# Patient Record
Sex: Male | Born: 1955 | Race: White | Hispanic: No | Marital: Single | Smoking: Current every day smoker
Health system: Southern US, Community
[De-identification: ages and names within clinical notes are randomized; demographics above are authoritative.]

---

## 1999-11-24 ENCOUNTER — Ambulatory Visit (HOSPITAL_COMMUNITY): Admission: RE | Admit: 1999-11-24 | Discharge: 1999-11-24 | Payer: Self-pay | Admitting: Urology

## 2000-12-16 ENCOUNTER — Encounter: Payer: Self-pay | Admitting: Emergency Medicine

## 2000-12-16 ENCOUNTER — Emergency Department (HOSPITAL_COMMUNITY): Admission: EM | Admit: 2000-12-16 | Discharge: 2000-12-16 | Payer: Self-pay | Admitting: *Deleted

## 2001-11-17 ENCOUNTER — Observation Stay (HOSPITAL_COMMUNITY): Admission: AC | Admit: 2001-11-17 | Discharge: 2001-11-17 | Payer: Self-pay

## 2001-11-17 ENCOUNTER — Encounter: Payer: Self-pay | Admitting: Surgery

## 2001-11-17 ENCOUNTER — Encounter: Payer: Self-pay | Admitting: Emergency Medicine

## 2002-07-01 ENCOUNTER — Encounter: Payer: Self-pay | Admitting: Emergency Medicine

## 2002-07-01 ENCOUNTER — Emergency Department (HOSPITAL_COMMUNITY): Admission: EM | Admit: 2002-07-01 | Discharge: 2002-07-01 | Payer: Self-pay | Admitting: Emergency Medicine

## 2002-07-02 ENCOUNTER — Emergency Department (HOSPITAL_COMMUNITY): Admission: EM | Admit: 2002-07-02 | Discharge: 2002-07-02 | Payer: Self-pay | Admitting: Emergency Medicine

## 2002-07-08 ENCOUNTER — Emergency Department (HOSPITAL_COMMUNITY): Admission: EM | Admit: 2002-07-08 | Discharge: 2002-07-08 | Payer: Self-pay | Admitting: Emergency Medicine

## 2002-07-11 ENCOUNTER — Encounter (HOSPITAL_COMMUNITY): Admission: RE | Admit: 2002-07-11 | Discharge: 2002-10-09 | Payer: Self-pay | Admitting: *Deleted

## 2009-01-20 ENCOUNTER — Emergency Department (HOSPITAL_COMMUNITY): Admission: EM | Admit: 2009-01-20 | Discharge: 2009-01-20 | Payer: Self-pay | Admitting: Emergency Medicine

## 2009-11-05 ENCOUNTER — Emergency Department (HOSPITAL_COMMUNITY): Admission: EM | Admit: 2009-11-05 | Discharge: 2009-11-05 | Payer: Self-pay | Admitting: Family Medicine

## 2010-08-06 LAB — COMPREHENSIVE METABOLIC PANEL
ALT: 23 U/L (ref 0–53)
AST: 26 U/L (ref 0–37)
Albumin: 4 g/dL (ref 3.5–5.2)
Alkaline Phosphatase: 55 U/L (ref 39–117)
BUN: 19 mg/dL (ref 6–23)
CO2: 25 mEq/L (ref 19–32)
Calcium: 9.5 mg/dL (ref 8.4–10.5)
Chloride: 107 mEq/L (ref 96–112)
Creatinine, Ser: 1.15 mg/dL (ref 0.4–1.5)
GFR calc Af Amer: >60 mL/min (ref >60)
GFR calc non Af Amer: >60 mL/min (ref >60)
Glucose, Bld: 105 mg/dL — ABNORMAL HIGH (ref 70–99)
Potassium: 4.1 mEq/L (ref 3.5–5.1)
Sodium: 139 mEq/L (ref 135–145)
Total Bilirubin: 0.7 mg/dL (ref 0.3–1.2)
Total Protein: 7.1 g/dL (ref 6.0–8.3)

## 2010-08-06 LAB — DIFFERENTIAL
Eosinophils Absolute: 0.1 10*3/uL (ref 0.0–0.7)
Eosinophils Relative: 2 % (ref 0–5)
Lymphs Abs: 2.1 10*3/uL (ref 0.7–4.0)
Monocytes Absolute: 0.8 10*3/uL (ref 0.1–1.0)
Monocytes Relative: 9 % (ref 3–12)

## 2010-08-06 LAB — CBC
HCT: 41.4 % (ref 39.0–52.0)
Hemoglobin: 14.4 g/dL (ref 13.0–17.0)
MCHC: 34.7 g/dL (ref 30.0–36.0)
MCV: 95.7 fL (ref 78.0–100.0)
Platelets: 210 10*3/uL (ref 150–400)
RBC: 4.33 MIL/uL (ref 4.22–5.81)
RDW: 13.5 % (ref 11.5–15.5)
WBC: 9.2 10*3/uL (ref 4.0–10.5)

## 2010-08-06 LAB — CK TOTAL AND CKMB (NOT AT ARMC)
CK, MB: 2.8 ng/mL (ref 0.3–4.0)
Relative Index: 2.1 (ref 0.0–2.5)
Total CK: 133 U/L (ref 7–232)

## 2013-08-05 ENCOUNTER — Other Ambulatory Visit (HOSPITAL_COMMUNITY): Payer: Self-pay | Admitting: Specialist

## 2013-08-05 ENCOUNTER — Ambulatory Visit (HOSPITAL_COMMUNITY)
Admission: RE | Admit: 2013-08-05 | Discharge: 2013-08-05 | Disposition: A | Discharge status: Home / Self Care | Payer: BC Managed Care – PPO | Source: Ambulatory Visit | Attending: Specialist | Admitting: Specialist

## 2013-08-05 DIAGNOSIS — Z0189 Encounter for other specified special examinations: Secondary | ICD-10-CM | POA: Insufficient documentation

## 2013-08-05 DIAGNOSIS — Z01818 Encounter for other preprocedural examination: Secondary | ICD-10-CM

## 2013-08-09 ENCOUNTER — Other Ambulatory Visit: Payer: Self-pay | Admitting: Specialist

## 2013-08-09 DIAGNOSIS — T1590XA Foreign body on external eye, part unspecified, unspecified eye, initial encounter: Secondary | ICD-10-CM

## 2013-08-14 ENCOUNTER — Ambulatory Visit
Admission: RE | Admit: 2013-08-14 | Discharge: 2013-08-14 | Disposition: A | Discharge status: Home / Self Care | Payer: BC Managed Care – PPO | Source: Ambulatory Visit | Attending: Specialist | Admitting: Specialist

## 2013-08-14 DIAGNOSIS — T1590XA Foreign body on external eye, part unspecified, unspecified eye, initial encounter: Secondary | ICD-10-CM

## 2014-08-21 ENCOUNTER — Emergency Department (HOSPITAL_COMMUNITY)
Admission: EM | Admit: 2014-08-21 | Discharge: 2014-08-21 | Disposition: A | Discharge status: Home / Self Care | Payer: Self-pay | Attending: Emergency Medicine | Admitting: Emergency Medicine

## 2014-08-21 ENCOUNTER — Encounter (HOSPITAL_COMMUNITY): Payer: Self-pay | Admitting: Physical Medicine and Rehabilitation

## 2014-08-21 ENCOUNTER — Emergency Department (HOSPITAL_COMMUNITY): Payer: Self-pay

## 2014-08-21 DIAGNOSIS — Z720 Tobacco use: Secondary | ICD-10-CM | POA: Insufficient documentation

## 2014-08-21 DIAGNOSIS — Y9289 Other specified places as the place of occurrence of the external cause: Secondary | ICD-10-CM | POA: Insufficient documentation

## 2014-08-21 DIAGNOSIS — Z23 Encounter for immunization: Secondary | ICD-10-CM | POA: Insufficient documentation

## 2014-08-21 DIAGNOSIS — Y998 Other external cause status: Secondary | ICD-10-CM | POA: Insufficient documentation

## 2014-08-21 DIAGNOSIS — S6991XA Unspecified injury of right wrist, hand and finger(s), initial encounter: Secondary | ICD-10-CM | POA: Insufficient documentation

## 2014-08-21 DIAGNOSIS — Y9389 Activity, other specified: Secondary | ICD-10-CM | POA: Insufficient documentation

## 2014-08-21 DIAGNOSIS — W228XXA Striking against or struck by other objects, initial encounter: Secondary | ICD-10-CM | POA: Insufficient documentation

## 2014-08-21 DIAGNOSIS — M7989 Other specified soft tissue disorders: Secondary | ICD-10-CM

## 2014-08-21 MED ORDER — TETANUS-DIPHTH-ACELL PERTUSSIS 5-2.5-18.5 LF-MCG/0.5 IM SUSP
0.5000 mL | Freq: Once | INTRAMUSCULAR | Status: AC
  Administered 2014-08-21: 0.5 mL via INTRAMUSCULAR
  Filled 2014-08-21: qty 0.5

## 2014-08-21 MED ORDER — IBUPROFEN 800 MG PO TABS: 800.0000 mg | ORAL_TABLET | Freq: Three times a day (TID) | ORAL | Status: AC

## 2014-08-21 MED ORDER — CEPHALEXIN 500 MG PO CAPS: 1000.0000 mg | ORAL_CAPSULE | Freq: Two times a day (BID) | ORAL | Status: AC

## 2014-08-21 MED ORDER — SULFAMETHOXAZOLE-TRIMETHOPRIM 800-160 MG PO TABS: 1.0000 | ORAL_TABLET | Freq: Two times a day (BID) | ORAL | Status: AC

## 2014-08-21 NOTE — ED Notes (Signed)
Pt presents to department for evaluation of R hand pain and swelling. Reports he accidentally got piece of metal stuck in hand x1 month ago. 5/10 pain upon arrival. Pt is alert and oriented x4.

## 2014-08-21 NOTE — ED Provider Notes (Signed)
CSN: [id number]     Arrival date & time 08/21/14  0810 History  This chart was scribed for non-physician practitioner, Fayrene Helper, PA-C, working with Purvis Sheffield, MD by Charline Bills, ED Scribe. This patient was seen in room TR07C/TR07C and the patient's care was started at 9:13 AM.   Chief Complaint  Patient presents with  . Hand Pain   The history is provided by the patient. No language interpreter was used.   HPI Comments: Donald COSGRIFF Sr. is a 59 y.o. male who presents to the Emergency Department complaining of gradually worsening R hand pain with associated swelling for the past week. Pt got a piece of wire stuck in his hand 1 month ago. He reports thumb tenderness and swelling 1 week ago, swelling and redness to his other fingers over the past 2 days. Pain is worse in the morning but improves as the day progesses and is exacerbated with bending his fingers. He denies fever, chills, R wrist pain, h/o arthritis or gout. No medications tried PTA. Pt is R hand dominant. Last tetanus is unknown.  No PCP.  History reviewed. No pertinent past medical history. History reviewed. No pertinent past surgical history. No family history on file. History  Substance Use Topics  . Smoking status: Current Every Day Smoker    Types: Cigarettes  . Smokeless tobacco: Not on file  . Alcohol Use: Yes    Review of Systems  Constitutional: Negative for fever and chills.  Musculoskeletal: Positive for joint swelling and arthralgias.   Allergies  Review of patient's allergies indicates no known allergies.  Home Medications   Prior to Admission medications   Not on File   BP 178/97 mmHg  Pulse 91  Temp(Src) 97.8 F (36.6 C) (Oral)  Resp 17  Ht  (1.727 m)  Wt 183 lb (83.008 kg)  BMI 27.83 kg/m2  SpO2 94% Physical Exam  Constitutional: He is oriented to person, place, and time. He appears well-developed and well-nourished. No distress.  HENT:  Head: Normocephalic and atraumatic.   Eyes: Conjunctivae and EOM are normal.  Neck: Neck supple. No tracheal deviation present.  Cardiovascular: Normal rate.   Pulmonary/Chest: Effort normal. No respiratory distress.  Musculoskeletal: Normal range of motion.  R hand: Small scab noted to the distal aspect of the thenar eminence.  Palm is mildly edematous, greater than L hand with generalized tenderness throughout but no focal point tenderness. Blanchable erythema. No abscess. No foreign object noted. Brisk cap refill small finger. Unable to make a fist 2/2 pain. Wrist is normal. Radial pulse 2+.  Forearm compartment is soft.    Neurological: He is alert and oriented to person, place, and time. No sensory deficit.  Skin: Skin is warm and dry.  Psychiatric: He has a normal mood and affect. His behavior is normal.  Nursing note and vitals reviewed.  ED Course  Procedures (including critical care time) DIAGNOSTIC STUDIES: Oxygen Saturation is 94% on RA, adequate by my interpretation.      COORDINATION OF CARE: 9:17 AM-Discussed treatment plan which includes XR and consult with hand with pt at bedside and pt agreed to plan.   9:34 AM Swelling and redness to R hand, has an old wound.  Suspect soft tissue infection, doubt abscess or tenosynovitis.  No joint involvement. Will give Tdap and also provide keflex/bactrim abx.  Pt to f/u with hand specialist for further care.  Return precaution discussed.  Ibuprofen for pain. Doubt compartment syndrome.   Labs Review Labs  Reviewed - No data to display  Imaging Review Dg Hand Complete Right  08/21/2014   CLINICAL DATA:  First metacarpal pain, injury 1 month ago, puncture injury  EXAM: RIGHT HAND - COMPLETE 3+ VIEW  COMPARISON:  None.  FINDINGS: Three views of the right hand submitted. No acute fracture or subluxation. There is old fracture of fifth metacarpal. No radiopaque foreign body.  IMPRESSION: No acute fracture or subluxation.  Old fracture of fifth metacarpal.   Electronically  Signed   By: Natasha MeadLiviu  Pop M.D.   On: 08/21/2014 08:39     EKG Interpretation None      MDM   Final diagnoses:  Swelling of right hand   BP 178/97 mmHg  Pulse 91  Temp(Src) 97.8 F (36.6 C) (Oral)  Resp 17  Ht 5\' 8"  (1.727 m)  Wt 183 lb (83.008 kg)  BMI 27.83 kg/m2  SpO2 94%   I personally performed the services described in this documentation, which was scribed in my presence. The recorded information has been reviewed and is accurate.     Fayrene HelperBowie Bianney Rockwood, PA-C 08/21/14 [id number]  Purvis SheffieldForrest Harrison, MD 08/21/14 [phone]

## 2014-08-21 NOTE — Discharge Instructions (Signed)
Please take antibiotics as prescribed for the full duration.  Take ibuprofen for pain.  Follow up with hand specialist in 2-3 days if you notice no improvement.  Return if your symptoms worsen or if you have other concerns.
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
## 2015-12-23 ENCOUNTER — Other Ambulatory Visit (HOSPITAL_COMMUNITY): Payer: Self-pay | Admitting: Nurse Practitioner

## 2015-12-23 DIAGNOSIS — B182 Chronic viral hepatitis C: Secondary | ICD-10-CM

## 2016-02-02 ENCOUNTER — Ambulatory Visit (HOSPITAL_COMMUNITY)
Admission: RE | Admit: 2016-02-02 | Discharge: 2016-02-02 | Disposition: A | Discharge status: Home / Self Care | Payer: BLUE CROSS/BLUE SHIELD | Source: Ambulatory Visit | Attending: Nurse Practitioner | Admitting: Nurse Practitioner

## 2016-02-02 ENCOUNTER — Encounter (HOSPITAL_COMMUNITY): Payer: Self-pay

## 2016-02-02 DIAGNOSIS — B182 Chronic viral hepatitis C: Secondary | ICD-10-CM

## 2016-08-04 ENCOUNTER — Other Ambulatory Visit: Payer: Self-pay | Admitting: *Deleted

## 2016-08-04 DIAGNOSIS — I70219 Atherosclerosis of native arteries of extremities with intermittent claudication, unspecified extremity: Secondary | ICD-10-CM

## 2016-09-16 ENCOUNTER — Encounter: Payer: Self-pay | Admitting: Vascular Surgery

## 2016-09-27 ENCOUNTER — Inpatient Hospital Stay (HOSPITAL_COMMUNITY): Admission: RE | Admit: 2016-09-27 | Payer: BLUE CROSS/BLUE SHIELD | Source: Ambulatory Visit

## 2016-09-27 ENCOUNTER — Encounter: Payer: BLUE CROSS/BLUE SHIELD | Admitting: Vascular Surgery

## 2018-09-05 IMAGING — US US ABDOMEN COMPLETE W/ ELASTOGRAPHY
1 series · 13 of 14 positions shown · non-contrast
Comparison: None

CLINICAL DATA: new diagnosis of hepatitis-C.



[Series 1: us abdomen complete w/ elastography · 0.14mm/px · 13 of 14 slices shown]
[im 1/14]
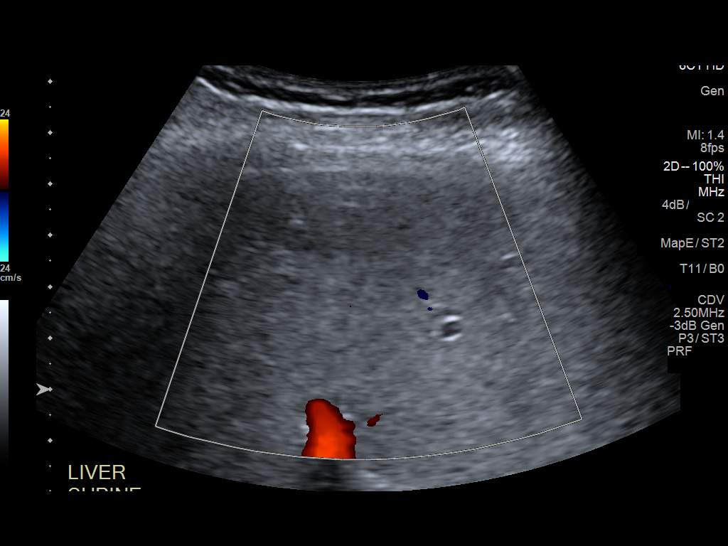
[im 2/14]
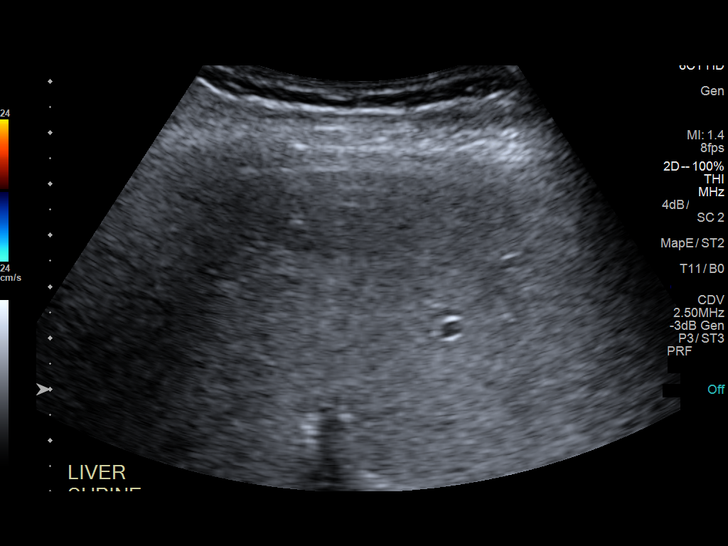
[im 3/14]
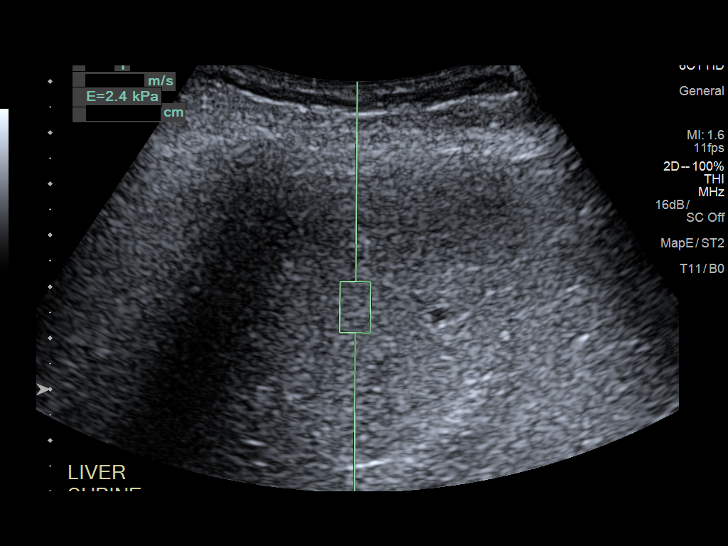
[im 4/14]
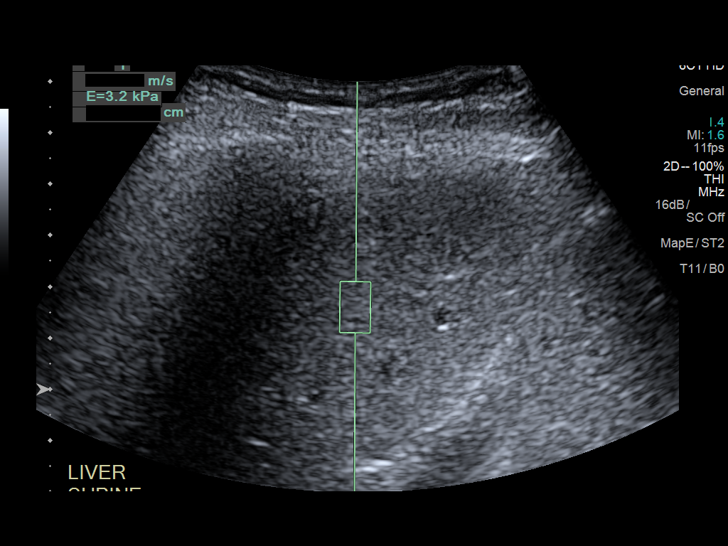
[im 5/14]
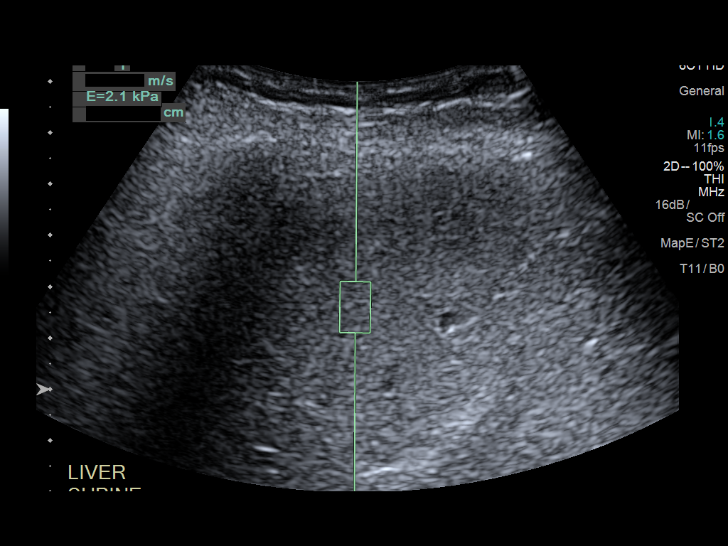
[im 6/14]
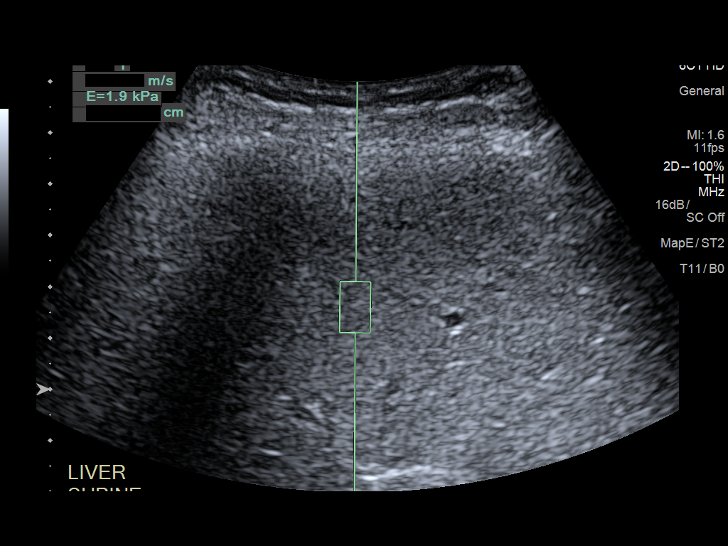
[im 8/14]
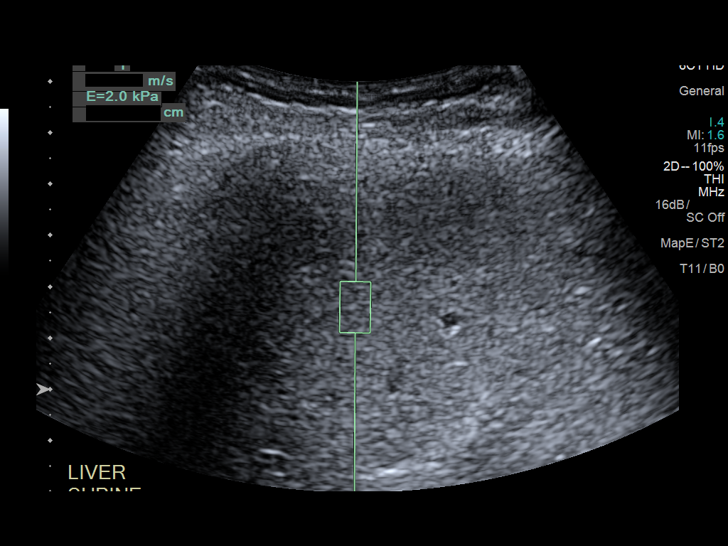
[im 9/14]
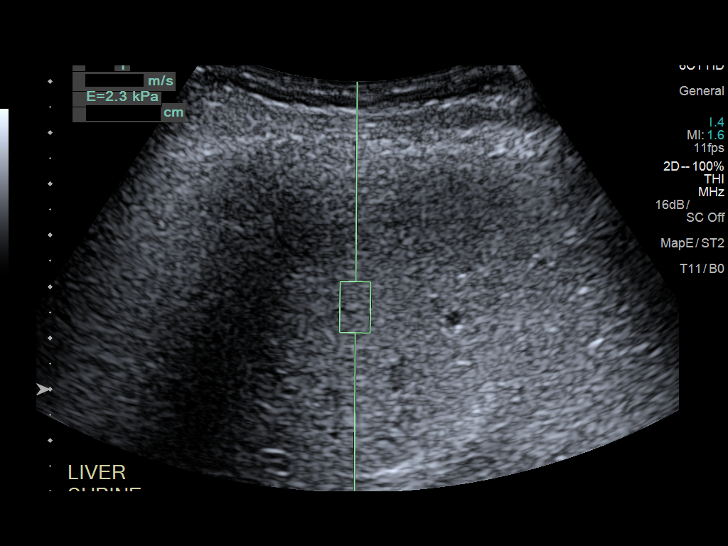
[im 10/14]
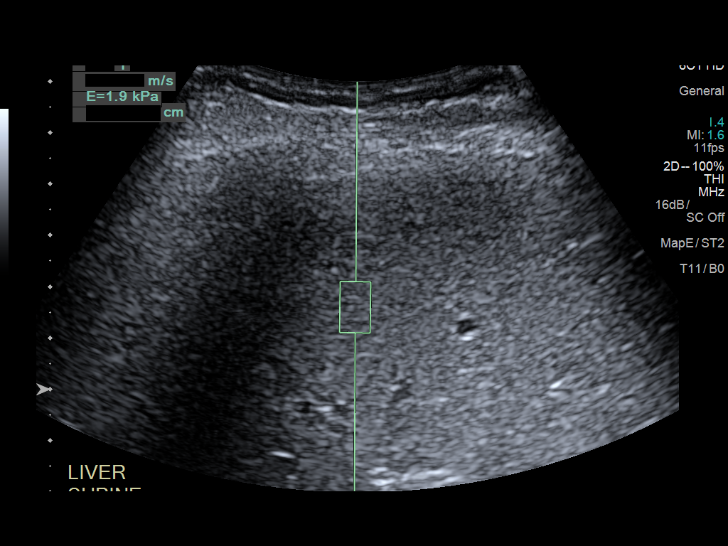
[im 11/14]
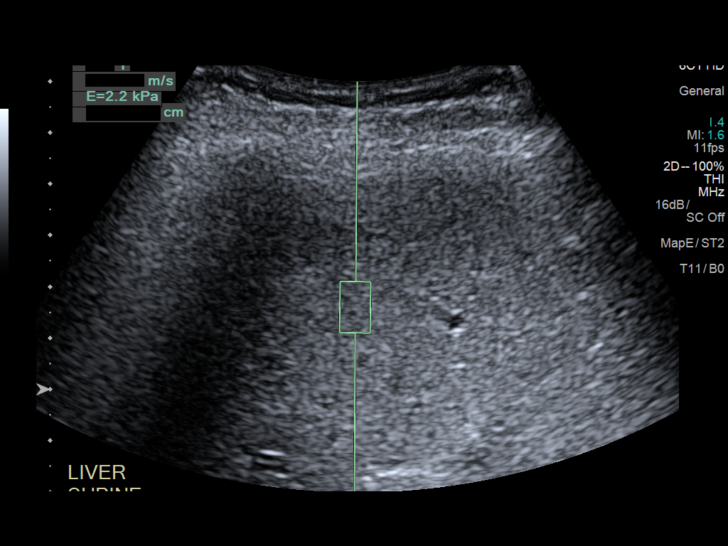
[im 12/14]
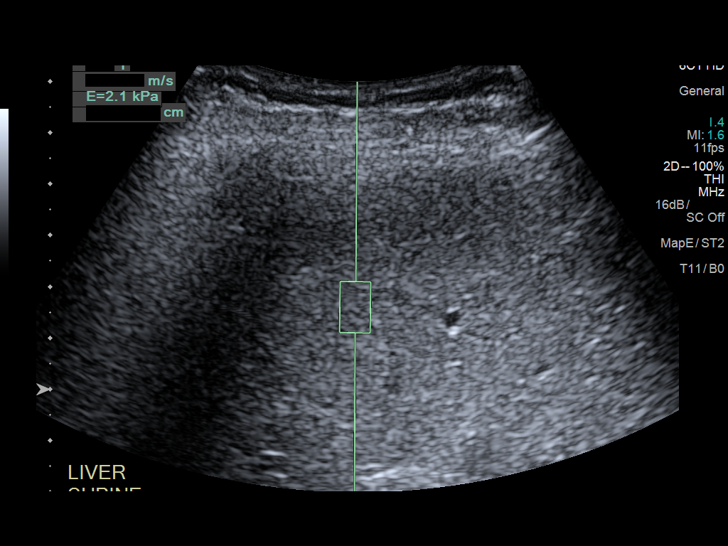
[im 13/14]
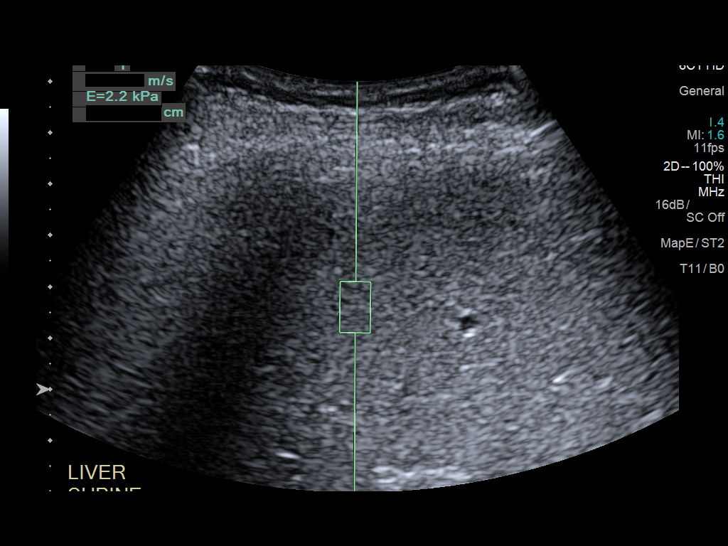
[im 14/14]
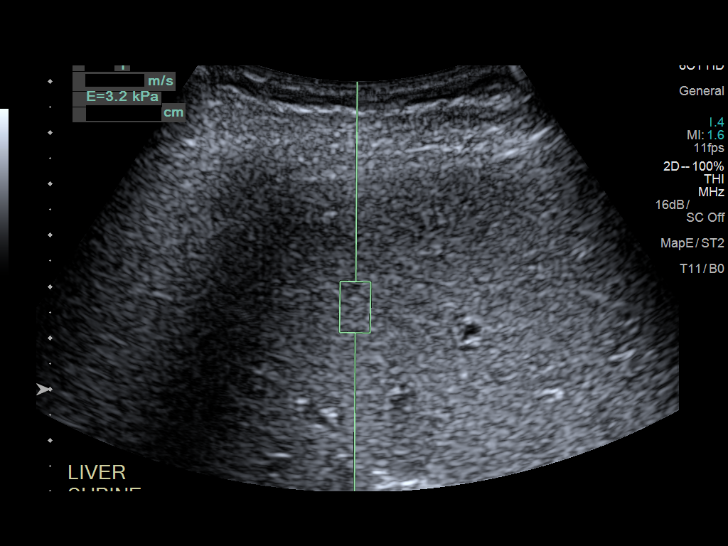

[13 of 14 positions shown; findings below may reference images not displayed]

FINDINGS: ULTRASOUND ABDOMEN

Gallbladder: No gallstones or wall thickening visualized. No
sonographic Murphy sign noted by sonographer.

Common bile duct: Diameter: Normal, 5 mm.

Liver: No focal lesion identified. Within normal limits in
parenchymal echogenicity.

IVC: No abnormality visualized.

Pancreas: Visualized portion unremarkable.

Spleen: Size and appearance within normal limits.

Right Kidney: Length: 10.3 cm. Echogenicity within normal limits. No
mass or hydronephrosis visualized.

Left Kidney: Length: 10.9 cm. Echogenicity within normal limits. No
mass or hydronephrosis visualized.

Abdominal aorta: No aneurysm visualized.

Other findings: No ascites.

ULTRASOUND HEPATIC ELASTOGRAPHY

Device: Siemens Helix VTQ

Patient position:  supine

Transducer 6C1

Number of measurements:  10

Hepatic Segment:  8

Median velocity:   0.84  m/sec

IQR:

IQR/Median velocity ratio

Corresponding Metavir fibrosis score:  F0/F1

Risk of fibrosis: Minimal

Limitations of exam: None

Pertinent findings noted on other imaging exams:  None

Please note that abnormal shear wave velocities may also be
identified in clinical settings other than with hepatic fibrosis,
such as: acute hepatitis, elevated right heart and central venous
pressures including use of beta blockers, Krunal disease
([place]), infiltrative processes such as
mastocytosis/amyloidosis/infiltrative tumor, extrahepatic
cholestasis, in the post-prandial state, and liver transplantation.
Correlation with patient history, laboratory data, and clinical
condition recommended.
IMPRESSION: Normal abdominal ultrasound.

Median hepatic shear wave velocity is calculated at 0.84 m/sec.

Corresponding Metavir fibrosis score is F0/F1.

Risk of fibrosis is minimal.

Follow-up:  None required

## 2020-01-01 DEATH — deceased
